# Patient Record
Sex: Female | Born: 1965 | Race: Black or African American | Hispanic: No | Marital: Single | Smoking: Never smoker
Health system: Southern US, Community
[De-identification: ages and names within clinical notes are randomized; demographics above are authoritative.]

---

## 2003-07-06 ENCOUNTER — Emergency Department (HOSPITAL_COMMUNITY): Admission: EM | Admit: 2003-07-06 | Discharge: 2003-07-06 | Payer: Self-pay | Admitting: Emergency Medicine

## 2004-10-26 ENCOUNTER — Emergency Department (HOSPITAL_COMMUNITY): Admission: EM | Admit: 2004-10-26 | Discharge: 2004-10-26 | Payer: Self-pay | Admitting: Emergency Medicine

## 2007-04-18 ENCOUNTER — Emergency Department (HOSPITAL_COMMUNITY): Admission: EM | Admit: 2007-04-18 | Discharge: 2007-04-18 | Payer: Self-pay | Admitting: Emergency Medicine

## 2008-10-25 ENCOUNTER — Encounter: Admission: RE | Admit: 2008-10-25 | Discharge: 2008-10-25 | Payer: Self-pay | Admitting: Internal Medicine

## 2009-12-12 ENCOUNTER — Emergency Department (HOSPITAL_COMMUNITY): Admission: EM | Admit: 2009-12-12 | Discharge: 2009-12-12 | Payer: Self-pay | Admitting: Family Medicine

## 2011-08-04 ENCOUNTER — Other Ambulatory Visit (HOSPITAL_COMMUNITY): Payer: Self-pay | Admitting: Internal Medicine

## 2011-08-04 DIAGNOSIS — Z1231 Encounter for screening mammogram for malignant neoplasm of breast: Secondary | ICD-10-CM

## 2011-09-03 ENCOUNTER — Ambulatory Visit (HOSPITAL_COMMUNITY)
Admission: RE | Admit: 2011-09-03 | Discharge: 2011-09-03 | Disposition: A | Discharge status: Home / Self Care | Payer: Self-pay | Source: Ambulatory Visit | Attending: Internal Medicine | Admitting: Internal Medicine

## 2011-09-03 DIAGNOSIS — Z1231 Encounter for screening mammogram for malignant neoplasm of breast: Secondary | ICD-10-CM

## 2013-12-04 ENCOUNTER — Other Ambulatory Visit (HOSPITAL_COMMUNITY): Payer: Self-pay | Admitting: Internal Medicine

## 2013-12-04 DIAGNOSIS — Z1231 Encounter for screening mammogram for malignant neoplasm of breast: Secondary | ICD-10-CM

## 2013-12-05 ENCOUNTER — Ambulatory Visit (HOSPITAL_COMMUNITY)
Admission: RE | Admit: 2013-12-05 | Discharge: 2013-12-05 | Disposition: A | Discharge status: Home / Self Care | Payer: 59 | Source: Ambulatory Visit | Attending: Internal Medicine | Admitting: Internal Medicine

## 2013-12-05 DIAGNOSIS — Z1231 Encounter for screening mammogram for malignant neoplasm of breast: Secondary | ICD-10-CM | POA: Insufficient documentation

## 2013-12-31 ENCOUNTER — Ambulatory Visit
Admission: RE | Admit: 2013-12-31 | Discharge: 2013-12-31 | Disposition: A | Discharge status: Home / Self Care | Payer: 59 | Source: Ambulatory Visit | Attending: Internal Medicine | Admitting: Internal Medicine

## 2013-12-31 ENCOUNTER — Other Ambulatory Visit: Payer: Self-pay | Admitting: Internal Medicine

## 2013-12-31 DIAGNOSIS — R1030 Lower abdominal pain, unspecified: Secondary | ICD-10-CM

## 2014-01-11 ENCOUNTER — Emergency Department (INDEPENDENT_AMBULATORY_CARE_PROVIDER_SITE_OTHER)
Admission: EM | Admit: 2014-01-11 | Discharge: 2014-01-11 | Disposition: A | Discharge status: Home / Self Care | Payer: Worker's Compensation | Source: Home / Self Care | Attending: Emergency Medicine | Admitting: Emergency Medicine

## 2014-01-11 ENCOUNTER — Encounter (HOSPITAL_COMMUNITY): Payer: Self-pay | Admitting: Emergency Medicine

## 2014-01-11 ENCOUNTER — Emergency Department (INDEPENDENT_AMBULATORY_CARE_PROVIDER_SITE_OTHER): Payer: Worker's Compensation

## 2014-01-11 DIAGNOSIS — S82009A Unspecified fracture of unspecified patella, initial encounter for closed fracture: Secondary | ICD-10-CM

## 2014-01-11 DIAGNOSIS — W010XXA Fall on same level from slipping, tripping and stumbling without subsequent striking against object, initial encounter: Secondary | ICD-10-CM

## 2014-01-11 DIAGNOSIS — Y9210 Unspecified residential institution as the place of occurrence of the external cause: Secondary | ICD-10-CM

## 2014-01-11 MED ORDER — HYDROCODONE-ACETAMINOPHEN 5-325 MG PO TABS
2.0000 | ORAL_TABLET | Freq: Once | ORAL | Status: AC
  Administered 2014-01-11: 2 via ORAL

## 2014-01-11 MED ORDER — HYDROCODONE-ACETAMINOPHEN 5-325 MG PO TABS
ORAL_TABLET | ORAL | Status: AC
  Filled 2014-01-11: qty 2

## 2014-01-11 MED ORDER — OXYCODONE-ACETAMINOPHEN 5-325 MG PO TABS: ORAL_TABLET | ORAL | Status: AC

## 2014-01-11 NOTE — Discharge Instructions (Signed)
Patellar Fracture, Adult A patellar fracture is a break in your kneecap (patella).  CAUSES   A direct blow to the knee or a fall is usually the cause of a broken patella.  A very hard and strong bending of your knee can cause a patellar fracture. RISK FACTORS Involvement in contact sports, especially sports that involve a lot of jumping. SIGNS AND SYMPTOMS   Tender and swollen knee.  Pain when you move your knee, especially when you try to straighten out your leg.  Difficulty walking or putting weight on your knee.  Misshapen knee (as if a bone is out of place). DIAGNOSIS  Patellar fracture is usually diagnosed with a physical exam and an X-ray exam. TREATMENT  Treatment depends on the type of fracture:  If your patella is still in the right position after the fracture and you can still straighten your leg out, you can usually be treated with a splint or cast for 4 6 weeks.  If your patella is broken into multiple small pieces but you are able to straighten your leg, you can usually be treated with a splint or cast for 4 6 weeks. Sometimes your patella may need to be removed before the cast is applied.  If you cannot straighten out your leg after a patellar fracture, then surgery is required to hold the bony fragments together until they heal. A cast or splint will be applied for 4 6 weeks. HOME CARE INSTRUCTIONS   Only take over-the-counter or prescription medicines for pain, discomfort, or fever as directed by your health care provider.  Use crutches as directed, and exercise the leg as directed.  Apply ice to the injured area:  Put ice in a plastic bag.  Place a towel between your skin and the bag.  Leave the ice on for 20 minutes, 2 3 times a day.  Elevate the affected knee above the level of your heart. SEEK MEDICAL CARE IF:  You suspect you have significantly injured your knee.  You hear a pop after a knee injury.  Your knee is misshapen after a knee  injury.  You have pain when you move your knee.  You have difficulty walking or putting weight on your knee.  You cannot fully move your knee. SEEK IMMEDIATE MEDICAL CARE IF:  You have redness, swelling, or increasing pain in your knee.  You have a fever. Document Released: 05/29/2003 Document Revised: 06/20/2013 Document Reviewed: 04/11/2013 [facility] Patient Information 2014 [place].

## 2014-01-11 NOTE — ED Notes (Signed)
Report falling down steps at work around 12:30 today injuring left knee.  No otc meds taken for pain.

## 2014-01-11 NOTE — ED Provider Notes (Signed)
Chief Complaint   Chief Complaint  Patient presents with  . Fall    left knee injury    History of Present Illness   Dorothy Sanchez is a 48 year old female who fell leaving work today at around 12:30 PM. 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 She slipped on some water on the steps and fell down about 6 steps. She landed on her buttock and her left leg twisted. The knee has been swollen since then, and she cannot bear weight on her left leg. She denies any other injury including no injury to her head, loss of consciousness, neck pain, chest or abdominal pain, lower back pain, or upper extremity pain.  Review of Systems   Other than as noted above, the patient denies any of the following symptoms: Systemic:  No fevers or chills. Musculoskeletal:  No arthritis, swelling, or joint pain.  Neurological:  No muscular weakness or paresthesias.  Selz   Past medical history, family history, social history, meds, and allergies were reviewed.   She is allergic to Cipro. Right now she's taking Septra for a UTI.  Physical Examination   Vital signs:  BP 119/71  Pulse 85  Temp(Src) 98.2 F (36.8 C) (Oral)  Resp 16  SpO2 98%  LMP 12/28/2013 Gen:  Alert and oriented times 3.  In no distress. Musculoskeletal: No swelling, bruising, or deformity. There is diffuse pain to palpation. Her knee has 0 range of motion due to pain. Unable to perform Lachman's test, anterior drawer sign, or McMurray's test. Extensor mechanism appears to be intact, although difficult to test because of the degree of pain that she's in. Otherwise, all joints had a full a ROM with no swelling, bruising or deformity.  No edema, pulses full. Extremities were warm and pink.  Capillary refill was brisk.  Skin:  Clear, warm and dry.  No rash. Neuro:  Alert and oriented times 3.  Muscle strength was normal.  Sensation was intact to light touch.   Radiology   Dg Knee Complete 4 Views Left  01/11/2014   CLINICAL DATA:  Fall.  EXAM: LEFT  KNEE - COMPLETE 4+ VIEW  COMPARISON:  None.  FINDINGS: Fracture of the inferior aspect of the patella is present. Fracture is minimally displaced. No other focal abnormality identified.  IMPRESSION: Minimally displaced fracture inferior aspect of the patella .   Electronically Signed   By: Marcello Moores  Register   On: 01/11/2014 19:42   I reviewed the images independently and personally and concur with the radiologist's findings.    Course in [facility] [place] 2 by mouth for pain, placed in a knee immobilizer and given crutches.  Assessment   The encounter diagnosis was Patella fracture.  I called the orthopedist on-call, Dr. Percell Miller. He agreed with plan to use the immobilizer and crutches and we'll see her next week and since this is a workers comp case she got to go to occupational health first, then get referred to the orthopedist.  Plan    1.  Meds:  The following meds were prescribed:   Discharge Medication List as of 01/11/2014  8:08 PM    START taking these medications   Details  oxyCODONE-acetaminophen (PERCOCET) 5-325 MG per tablet 1 to 2 tablets every 6 hours as needed for pain., Print        2.  Patient Education/Counseling:  The patient was given appropriate handouts, self care instructions, and instructed in symptomatic relief, including rest and activity, elevation, application  of ice and compression.    3.  Follow up:  The patient was told to follow up here if no better in 3 to 4 days, or sooner if becoming worse in any way, and given some red flag symptoms such as worsening pain or neurological symptoms which would prompt immediate return.       Harden Mo, MD 01/11/14 2227

## 2019-06-12 ENCOUNTER — Other Ambulatory Visit: Payer: Self-pay | Admitting: Internal Medicine

## 2019-06-12 ENCOUNTER — Other Ambulatory Visit: Payer: BLUE CROSS/BLUE SHIELD

## 2019-06-12 DIAGNOSIS — R0781 Pleurodynia: Secondary | ICD-10-CM

## 2019-06-12 DIAGNOSIS — M546 Pain in thoracic spine: Secondary | ICD-10-CM

## 2019-07-23 ENCOUNTER — Other Ambulatory Visit: Payer: Self-pay

## 2019-07-23 DIAGNOSIS — Z20822 Contact with and (suspected) exposure to covid-19: Secondary | ICD-10-CM

## 2019-07-24 LAB — NOVEL CORONAVIRUS, NAA: SARS-CoV-2, NAA: NOT DETECTED

## 2019-07-26 ENCOUNTER — Telehealth: Payer: Self-pay | Admitting: Internal Medicine

## 2019-07-26 NOTE — Telephone Encounter (Signed)
Negative COVID results given. Patient results "NOT Detected." Caller expressed understanding. ° °

## 2019-09-03 ENCOUNTER — Other Ambulatory Visit: Payer: Self-pay

## 2019-11-12 ENCOUNTER — Ambulatory Visit: Payer: Self-pay | Attending: Internal Medicine

## 2019-11-12 DIAGNOSIS — Z23 Encounter for immunization: Secondary | ICD-10-CM | POA: Insufficient documentation

## 2019-11-12 NOTE — Progress Notes (Signed)
   Covid-19 Vaccination Clinic  Name:  Dorothy Sanchez    MRN: [id number] DOB: [birth year]  11/12/2019  [name] was observed post Covid-19 immunization for 15 minutes without incidence. She was provided with Vaccine Information Sheet and instruction to access the V-Safe system.   [name] was instructed to call 911 with any severe reactions post vaccine: Marland Kitchen Difficulty breathing  . Swelling of your face and throat  . A fast heartbeat  . A bad rash all over your body  . Dizziness and weakness    Immunizations Administered    Name Date Dose VIS Date Route   Pfizer COVID-19 Vaccine 11/12/2019  9:02 AM 0.3 mL 08/24/2019 Intramuscular   Manufacturer: [place]   Lot: [id number]   [place]: [id number]

## 2019-11-15 ENCOUNTER — Other Ambulatory Visit: Payer: Self-pay | Admitting: Internal Medicine

## 2019-11-15 DIAGNOSIS — Z1231 Encounter for screening mammogram for malignant neoplasm of breast: Secondary | ICD-10-CM

## 2019-12-05 ENCOUNTER — Ambulatory Visit: Payer: Self-pay | Attending: Internal Medicine

## 2019-12-05 DIAGNOSIS — Z23 Encounter for immunization: Secondary | ICD-10-CM

## 2019-12-05 NOTE — Progress Notes (Signed)
   Covid-19 Vaccination Clinic  Name:  Dorothy Sanchez    MRN: [id number] DOB: 1966/03/26  12/05/2019  [name] was observed post Covid-19 immunization for 15 minutes without incident. She was provided with Vaccine Information Sheet and instruction to access the V-Safe system.   [name] was instructed to call 911 with any severe reactions post vaccine: Marland Kitchen Difficulty breathing  . Swelling of face and throat  . A fast heartbeat  . A bad rash all over body  . Dizziness and weakness   Immunizations Administered    Name Date Dose VIS Date Route   Pfizer COVID-19 Vaccine 12/05/2019  9:21 AM 0.3 mL 08/24/2019 Intramuscular   Manufacturer: [place]   Lot: [id number]   [place]: [id number]

## 2020-04-02 ENCOUNTER — Institutional Professional Consult (permissible substitution): Payer: BC Managed Care – PPO | Admitting: Pulmonary Disease

## 2021-06-25 ENCOUNTER — Other Ambulatory Visit: Payer: Self-pay | Admitting: Internal Medicine

## 2021-06-25 ENCOUNTER — Ambulatory Visit
Admission: RE | Admit: 2021-06-25 | Discharge: 2021-06-25 | Disposition: A | Discharge status: Home / Self Care | Payer: BC Managed Care – PPO | Source: Ambulatory Visit | Attending: Internal Medicine | Admitting: Internal Medicine

## 2021-06-25 ENCOUNTER — Other Ambulatory Visit: Payer: Self-pay

## 2021-06-25 DIAGNOSIS — R52 Pain, unspecified: Secondary | ICD-10-CM

## 2021-12-16 IMAGING — CR DG LUMBAR SPINE COMPLETE 4+V
5 series · 5 of 5 positions shown · non-contrast
Comparison: 10/25/2008

CLINICAL DATA: Pain.

EXAM:
LUMBAR SPINE - COMPLETE 4+ VIEW

[w lumbar spine ap]
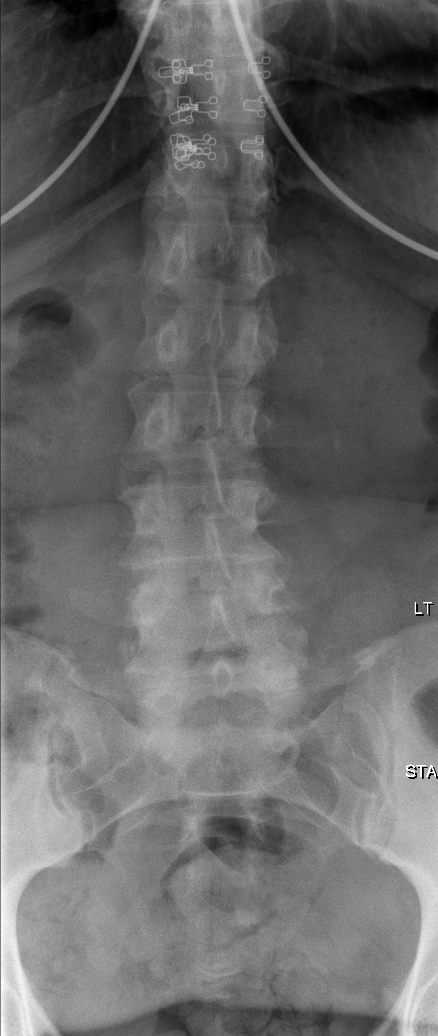

[w lumbar spine obl (1 of 2)]
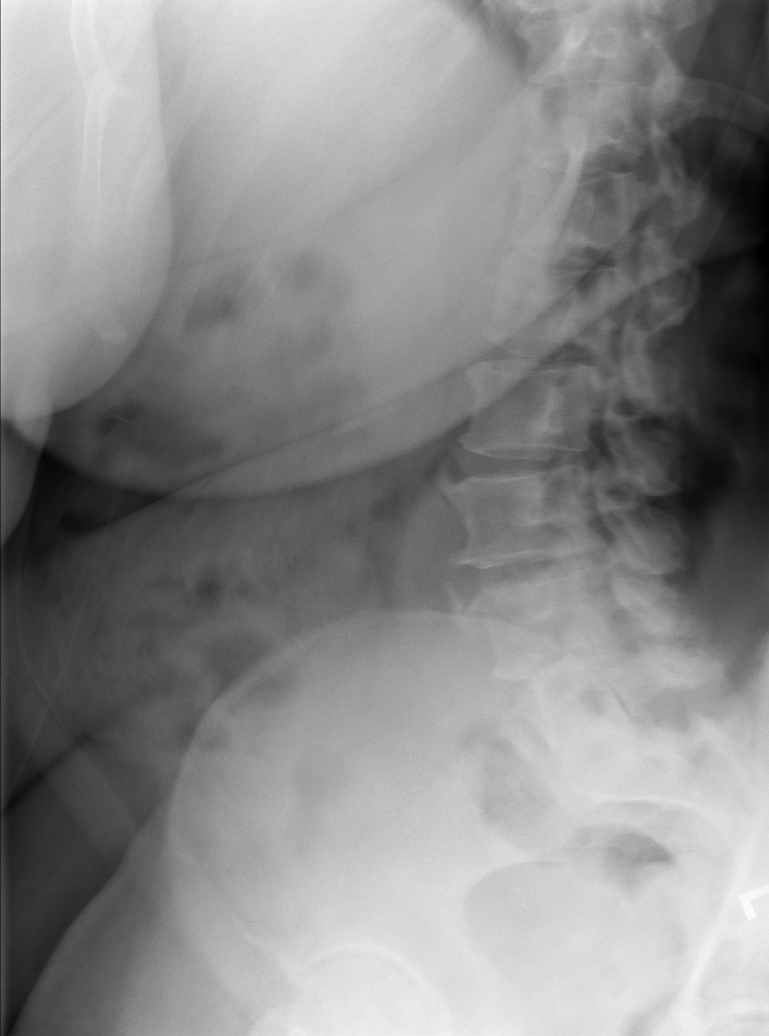

[w lumbar spine obl (2 of 2)]
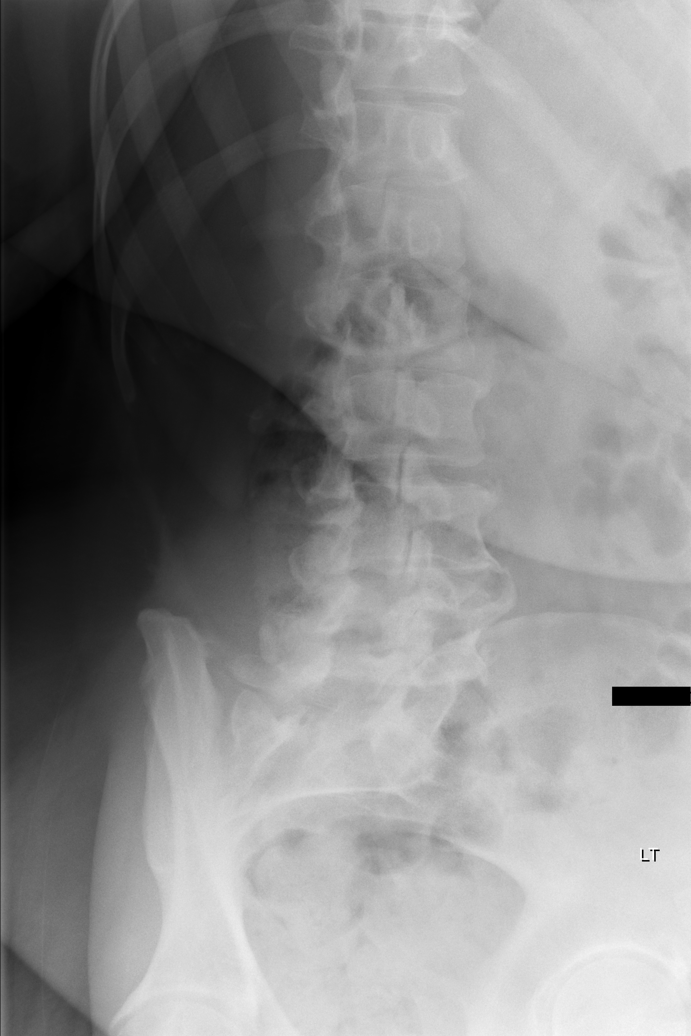

[w lumbar spine lat]
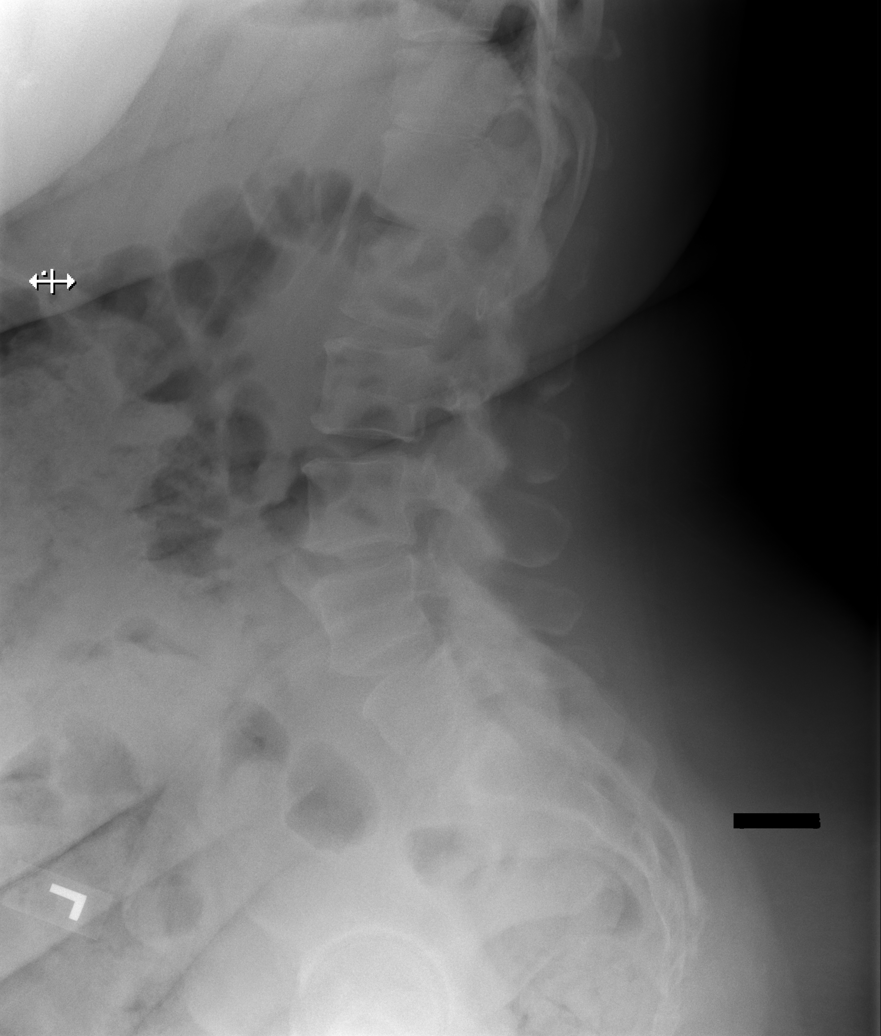

[w lumbar l-5 s-1 spot]
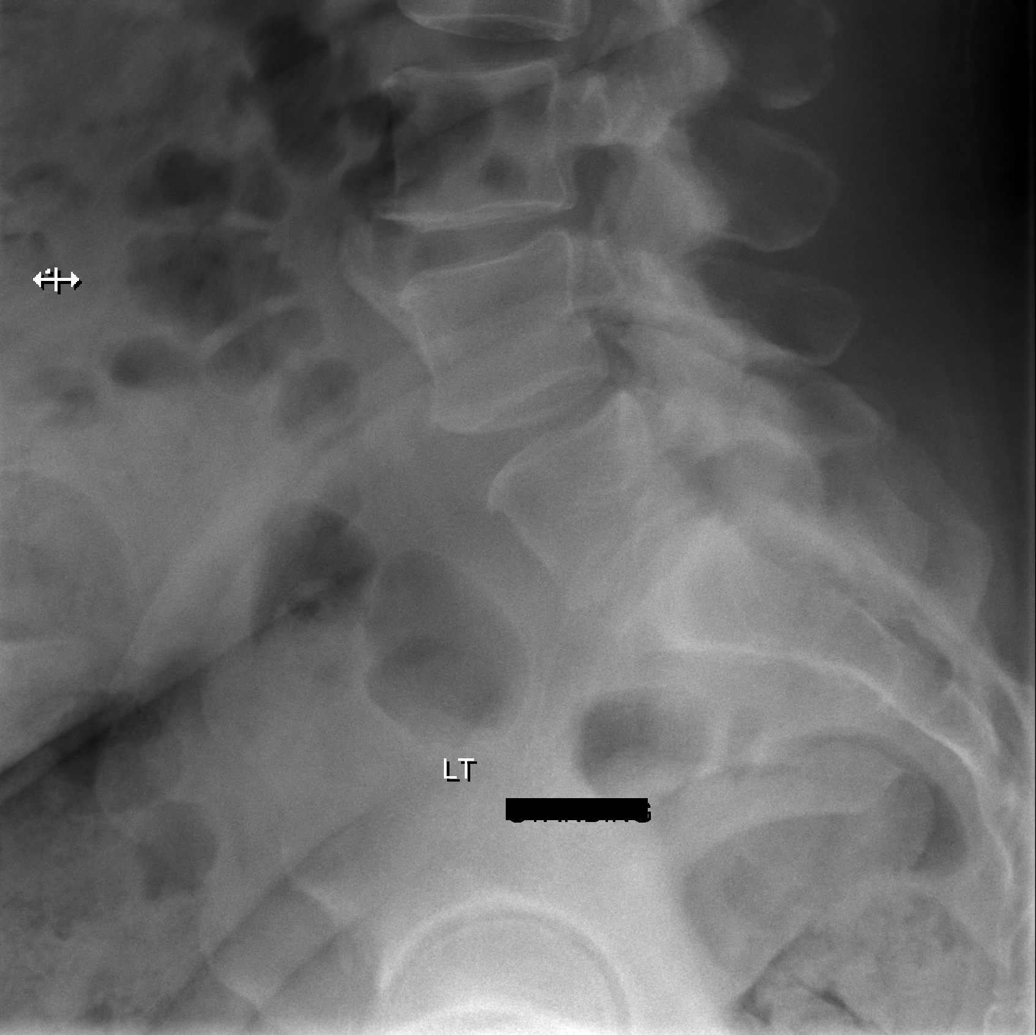

[5 of 5 positions shown; findings below may reference images not displayed]

FINDINGS: Minimal curvature in the lumbar spine. No evidence for a pars
defect. Vertebral body heights and disc spaces are maintained.
IMPRESSION: No acute abnormality in lumbar spine

## 2022-04-09 ENCOUNTER — Other Ambulatory Visit: Payer: Self-pay | Admitting: Student in an Organized Health Care Education/Training Program

## 2022-04-09 DIAGNOSIS — M5416 Radiculopathy, lumbar region: Secondary | ICD-10-CM

## 2022-05-06 ENCOUNTER — Other Ambulatory Visit: Payer: BC Managed Care – PPO

## 2022-05-06 ENCOUNTER — Ambulatory Visit
Admission: RE | Admit: 2022-05-06 | Discharge: 2022-05-06 | Disposition: A | Discharge status: Home / Self Care | Payer: BC Managed Care – PPO | Source: Ambulatory Visit | Attending: Student in an Organized Health Care Education/Training Program | Admitting: Student in an Organized Health Care Education/Training Program

## 2022-05-06 DIAGNOSIS — M5416 Radiculopathy, lumbar region: Secondary | ICD-10-CM

## 2022-11-18 ENCOUNTER — Other Ambulatory Visit: Payer: Self-pay

## 2022-11-18 ENCOUNTER — Emergency Department (HOSPITAL_COMMUNITY)
Admission: EM | Admit: 2022-11-18 | Discharge: 2022-11-18 | Disposition: A | Discharge status: Home / Self Care | Payer: No Typology Code available for payment source | Attending: Emergency Medicine | Admitting: Emergency Medicine

## 2022-11-18 DIAGNOSIS — Z91040 Latex allergy status: Secondary | ICD-10-CM | POA: Insufficient documentation

## 2022-11-18 DIAGNOSIS — D279 Benign neoplasm of unspecified ovary: Secondary | ICD-10-CM | POA: Diagnosis not present

## 2022-11-18 DIAGNOSIS — D369 Benign neoplasm, unspecified site: Secondary | ICD-10-CM

## 2022-11-18 DIAGNOSIS — R220 Localized swelling, mass and lump, head: Secondary | ICD-10-CM | POA: Diagnosis present

## 2022-11-18 MED ORDER — CEPHALEXIN 500 MG PO CAPS: 500.0000 mg | ORAL_CAPSULE | Freq: Two times a day (BID) | ORAL | 0 refills | Status: AC

## 2022-11-18 NOTE — ED Triage Notes (Signed)
Pt coming in for a painful bump on the back left side of her head for apprx 4 wks. Denies discharge, does endorse fever says she took tylenol for this morning apprx 4am.

## 2022-11-18 NOTE — Discharge Instructions (Signed)
You can take ibuprofen at home, over the counter, for pain and swelling.  You can buy lidocaine jelly over the counter at a pharmacy, which is a numbing jelly you can apply to the cyst twice a day for pain relief.  You can also use over the counter muscle rubs like Ephraim Hamburger or [facility] on your neck muscles (do not apply these directly to your cyst).

## 2022-11-18 NOTE — ED Provider Notes (Signed)
[facility] Provider Note   CSN: [id number] Arrival date & time: 11/18/22  0813     History CC: Lump on head  Dorothy Sanchez is a 57 y.o. female presented to ED with complaint of a painful lump on the back of her scalp.  She says she noticed this first about 4 weeks ago but it is grown larger in size.  It is very tender to the touch.  She denies prior issues with abscess or cyst.  HPI     Home Medications Prior to Admission medications   Medication Sig Start Date End Date Taking? Authorizing Provider  cephALEXin (KEFLEX) 500 MG capsule Take 1 capsule (500 mg total) by mouth 2 (two) times daily for 7 days. 11/18/22 11/25/22 Yes Chloie Loney, Carola Rhine, MD  oxyCODONE-acetaminophen (PERCOCET) 5-325 MG per tablet 1 to 2 tablets every 6 hours as needed for pain. 01/11/14   Harden Mo, MD  sulfamethoxazole-trimethoprim (BACTRIM,SEPTRA) 400-80 MG per tablet Take 1 tablet by mouth 2 (two) times daily.    [provider]      Allergies    Ciprofloxacin and Latex    Review of Systems   Review of Systems  Physical Exam Updated Vital Signs BP (!) 145/67 (BP Location: Left Arm)   Pulse (!) 102   Temp 98.1 F (36.7 C) (Oral)   Resp 18   Ht '5\' 6"'$  (1.676 m)   Wt 127 kg   LMP 12/28/2013   SpO2 100%   BMI 45.19 kg/m  Physical Exam Constitutional:      General: She is not in acute distress. HENT:     Head: Normocephalic and atraumatic.      Comments: Small region of tenderness induration on the right posterior occipital scalp; no active drainage Eyes:     Conjunctiva/sclera: Conjunctivae normal.     Pupils: Pupils are equal, round, and reactive to light.  Cardiovascular:     Rate and Rhythm: Normal rate and regular rhythm.  Pulmonary:     Effort: Pulmonary effort is normal. No respiratory distress.  Skin:    General: Skin is warm and dry.  Neurological:     General: No focal deficit present.     Mental Status: She is alert.  Mental status is at baseline.  Psychiatric:        Mood and Affect: Mood normal.        Behavior: Behavior normal.     ED Results / Procedures / Treatments   Labs (all labs ordered are listed, but only abnormal results are displayed) Labs Reviewed - No data to display  EKG None  Radiology No results found.  Procedures Procedures    Medications Ordered in ED Medications - No data to display  ED Course/ Medical Decision Making/ A&P                             Medical Decision Making Risk Prescription drug management.   Patient is here with tender growth on her right posterior scalp.  Differential would include abscess versus epidermal cyst versus other.  Bedside ultrasound is most consistent with a cyst, well-encapsulated circular structure.  This would likely need to be deroofed and sac removed; recommending general surgery office evaluation for this.    Starting on keflex for possibility of infected cyst.  Advised motrin, heat on her neck muscles, lidocaine jelly on site.  Patient verbalized understanding.  Final Clinical Impression(s) / ED Diagnoses Final diagnoses:  Dermoid cyst    Rx / DC Orders ED Discharge Orders          Ordered    cephALEXin (KEFLEX) 500 MG capsule  2 times daily        11/18/22 0849              Wyvonnia Dusky, MD 11/18/22 [phone]
